# Patient Record
Sex: Male | Born: 1990 | Race: Black or African American | Hispanic: No | Marital: Single | Smoking: Never smoker
Health system: Southern US, Community
[De-identification: ages and names within clinical notes are randomized; demographics above are authoritative.]

## PROBLEM LIST (undated history)

## (undated) DIAGNOSIS — J45909 Unspecified asthma, uncomplicated: Secondary | ICD-10-CM

---

## 2015-10-16 ENCOUNTER — Emergency Department
Admission: EM | Admit: 2015-10-16 | Discharge: 2015-10-16 | Disposition: A | Discharge status: Home / Self Care | Payer: Self-pay | Attending: Emergency Medicine | Admitting: Emergency Medicine

## 2015-10-16 ENCOUNTER — Encounter: Payer: Self-pay | Admitting: Emergency Medicine

## 2015-10-16 ENCOUNTER — Emergency Department: Payer: Self-pay

## 2015-10-16 DIAGNOSIS — Y998 Other external cause status: Secondary | ICD-10-CM | POA: Insufficient documentation

## 2015-10-16 DIAGNOSIS — S0990XA Unspecified injury of head, initial encounter: Secondary | ICD-10-CM | POA: Insufficient documentation

## 2015-10-16 DIAGNOSIS — S01312A Laceration without foreign body of left ear, initial encounter: Secondary | ICD-10-CM | POA: Insufficient documentation

## 2015-10-16 DIAGNOSIS — S0083XA Contusion of other part of head, initial encounter: Secondary | ICD-10-CM

## 2015-10-16 DIAGNOSIS — Y9389 Activity, other specified: Secondary | ICD-10-CM | POA: Insufficient documentation

## 2015-10-16 DIAGNOSIS — S1181XA Laceration without foreign body of other specified part of neck, initial encounter: Secondary | ICD-10-CM | POA: Insufficient documentation

## 2015-10-16 DIAGNOSIS — S0191XA Laceration without foreign body of unspecified part of head, initial encounter: Secondary | ICD-10-CM

## 2015-10-16 DIAGNOSIS — Y9289 Other specified places as the place of occurrence of the external cause: Secondary | ICD-10-CM | POA: Insufficient documentation

## 2015-10-16 DIAGNOSIS — S01512A Laceration without foreign body of oral cavity, initial encounter: Secondary | ICD-10-CM | POA: Insufficient documentation

## 2015-10-16 DIAGNOSIS — S1191XA Laceration without foreign body of unspecified part of neck, initial encounter: Secondary | ICD-10-CM

## 2015-10-16 DIAGNOSIS — Z23 Encounter for immunization: Secondary | ICD-10-CM | POA: Insufficient documentation

## 2015-10-16 HISTORY — DX: Unspecified asthma, uncomplicated: J45.909

## 2015-10-16 MED ORDER — ACETAMINOPHEN 500 MG PO TABS
1000.0000 mg | ORAL_TABLET | Freq: Once | ORAL | Status: AC
  Administered 2015-10-16: 1000 mg via ORAL
  Filled 2015-10-16: qty 2

## 2015-10-16 MED ORDER — TETANUS-DIPHTH-ACELL PERTUSSIS 5-2.5-18.5 LF-MCG/0.5 IM SUSP
0.5000 mL | Freq: Once | INTRAMUSCULAR | Status: AC
  Administered 2015-10-16: 0.5 mL via INTRAMUSCULAR
  Filled 2015-10-16: qty 0.5

## 2015-10-16 MED ORDER — LIDOCAINE HCL (PF) 1 % IJ SOLN
5.0000 mL | Freq: Once | INTRAMUSCULAR | Status: AC
  Administered 2015-10-16: 5 mL
  Filled 2015-10-16: qty 5

## 2015-10-16 MED ORDER — TRAMADOL HCL 50 MG PO TABS: 50.0000 mg | ORAL_TABLET | Freq: Two times a day (BID) | ORAL | Status: AC

## 2015-10-16 MED ORDER — BACITRACIN ZINC 500 UNIT/GM EX OINT
TOPICAL_OINTMENT | Freq: Two times a day (BID) | CUTANEOUS | Status: DC
  Administered 2015-10-16: 10:00:00 via TOPICAL
  Filled 2015-10-16: qty 0.9

## 2015-10-16 NOTE — ED Notes (Signed)
Patient transported to CT 
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
## 2015-10-16 NOTE — ED Notes (Signed)
Returned from CT.
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
## 2015-10-26 ENCOUNTER — Emergency Department
Admission: EM | Admit: 2015-10-26 | Discharge: 2015-10-26 | Disposition: A | Discharge status: Home / Self Care | Payer: Self-pay | Attending: Student | Admitting: Student

## 2015-10-26 DIAGNOSIS — Z4802 Encounter for removal of sutures: Secondary | ICD-10-CM | POA: Insufficient documentation

## 2015-10-26 DIAGNOSIS — Z79899 Other long term (current) drug therapy: Secondary | ICD-10-CM | POA: Insufficient documentation

## 2015-10-26 NOTE — ED Notes (Signed)
Pt alert and oriented X4, active, cooperative, pt in NAD. RR even and unlabored, color WNL.  Pt informed to return if any life threatening symptoms occur.   

## 2015-10-26 NOTE — ED Notes (Signed)
Patient here to have sutures removed from behind left ear.

## 2015-10-26 NOTE — ED Notes (Signed)
Patient states that he is here to have sutures behind left ear removed. Patient denies any drainage or redness to the area.

## 2015-10-26 NOTE — Discharge Instructions (Signed)
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
## 2016-01-24 ENCOUNTER — Emergency Department: Payer: Self-pay

## 2016-01-24 ENCOUNTER — Encounter: Payer: Self-pay | Admitting: *Deleted

## 2016-01-24 DIAGNOSIS — Y9289 Other specified places as the place of occurrence of the external cause: Secondary | ICD-10-CM | POA: Insufficient documentation

## 2016-01-24 DIAGNOSIS — Y990 Civilian activity done for income or pay: Secondary | ICD-10-CM | POA: Insufficient documentation

## 2016-01-24 DIAGNOSIS — Z79899 Other long term (current) drug therapy: Secondary | ICD-10-CM | POA: Insufficient documentation

## 2016-01-24 DIAGNOSIS — Y9389 Activity, other specified: Secondary | ICD-10-CM | POA: Insufficient documentation

## 2016-01-24 DIAGNOSIS — W010XXA Fall on same level from slipping, tripping and stumbling without subsequent striking against object, initial encounter: Secondary | ICD-10-CM | POA: Insufficient documentation

## 2016-01-24 DIAGNOSIS — S7001XA Contusion of right hip, initial encounter: Secondary | ICD-10-CM | POA: Insufficient documentation

## 2016-01-24 NOTE — ED Notes (Addendum)
Pt fell a few days ago co persistent right hip pain. Pt ambulatory at this time.

## 2016-01-24 NOTE — ED Notes (Addendum)
Pt has right hip pain.  Pt slipped on a wet floor 3 days ago at work.  Pt was seen at an urgent care and was given pain meds.  States pain meds are not helping.   States not WC.

## 2016-01-25 ENCOUNTER — Emergency Department
Admission: EM | Admit: 2016-01-25 | Discharge: 2016-01-25 | Disposition: A | Discharge status: Home / Self Care | Payer: Self-pay | Attending: Emergency Medicine | Admitting: Emergency Medicine

## 2016-01-25 DIAGNOSIS — S7001XA Contusion of right hip, initial encounter: Secondary | ICD-10-CM

## 2016-01-25 MED ORDER — CYCLOBENZAPRINE HCL 10 MG PO TABS
10.0000 mg | ORAL_TABLET | Freq: Once | ORAL | Status: AC
  Administered 2016-01-25: 10 mg via ORAL
  Filled 2016-01-25: qty 1

## 2016-01-25 MED ORDER — KETOROLAC TROMETHAMINE 10 MG PO TABS: 10.0000 mg | ORAL_TABLET | Freq: Three times a day (TID) | ORAL | Status: AC | PRN

## 2016-01-25 MED ORDER — KETOROLAC TROMETHAMINE 10 MG PO TABS
10.0000 mg | ORAL_TABLET | Freq: Once | ORAL | Status: AC
  Administered 2016-01-25: 10 mg via ORAL
  Filled 2016-01-25: qty 1
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
## 2016-01-25 NOTE — Discharge Instructions (Signed)

## 2016-08-02 IMAGING — CR DG HIP (WITH OR WITHOUT PELVIS) 2-3V*R*
1 series · 3 of 3 positions shown · non-contrast
Comparison: None.

CLINICAL DATA: The patient slipped on a wet floor 3 days ago with a
right hip injury. Continued pain. Initial encounter.

EXAM:
DG HIP (WITH OR WITHOUT PELVIS) 2-3V RIGHT

[Series 1: t pelvis ap · 0.14mm/px · 3 of 3 slices shown]
[im 1/3]
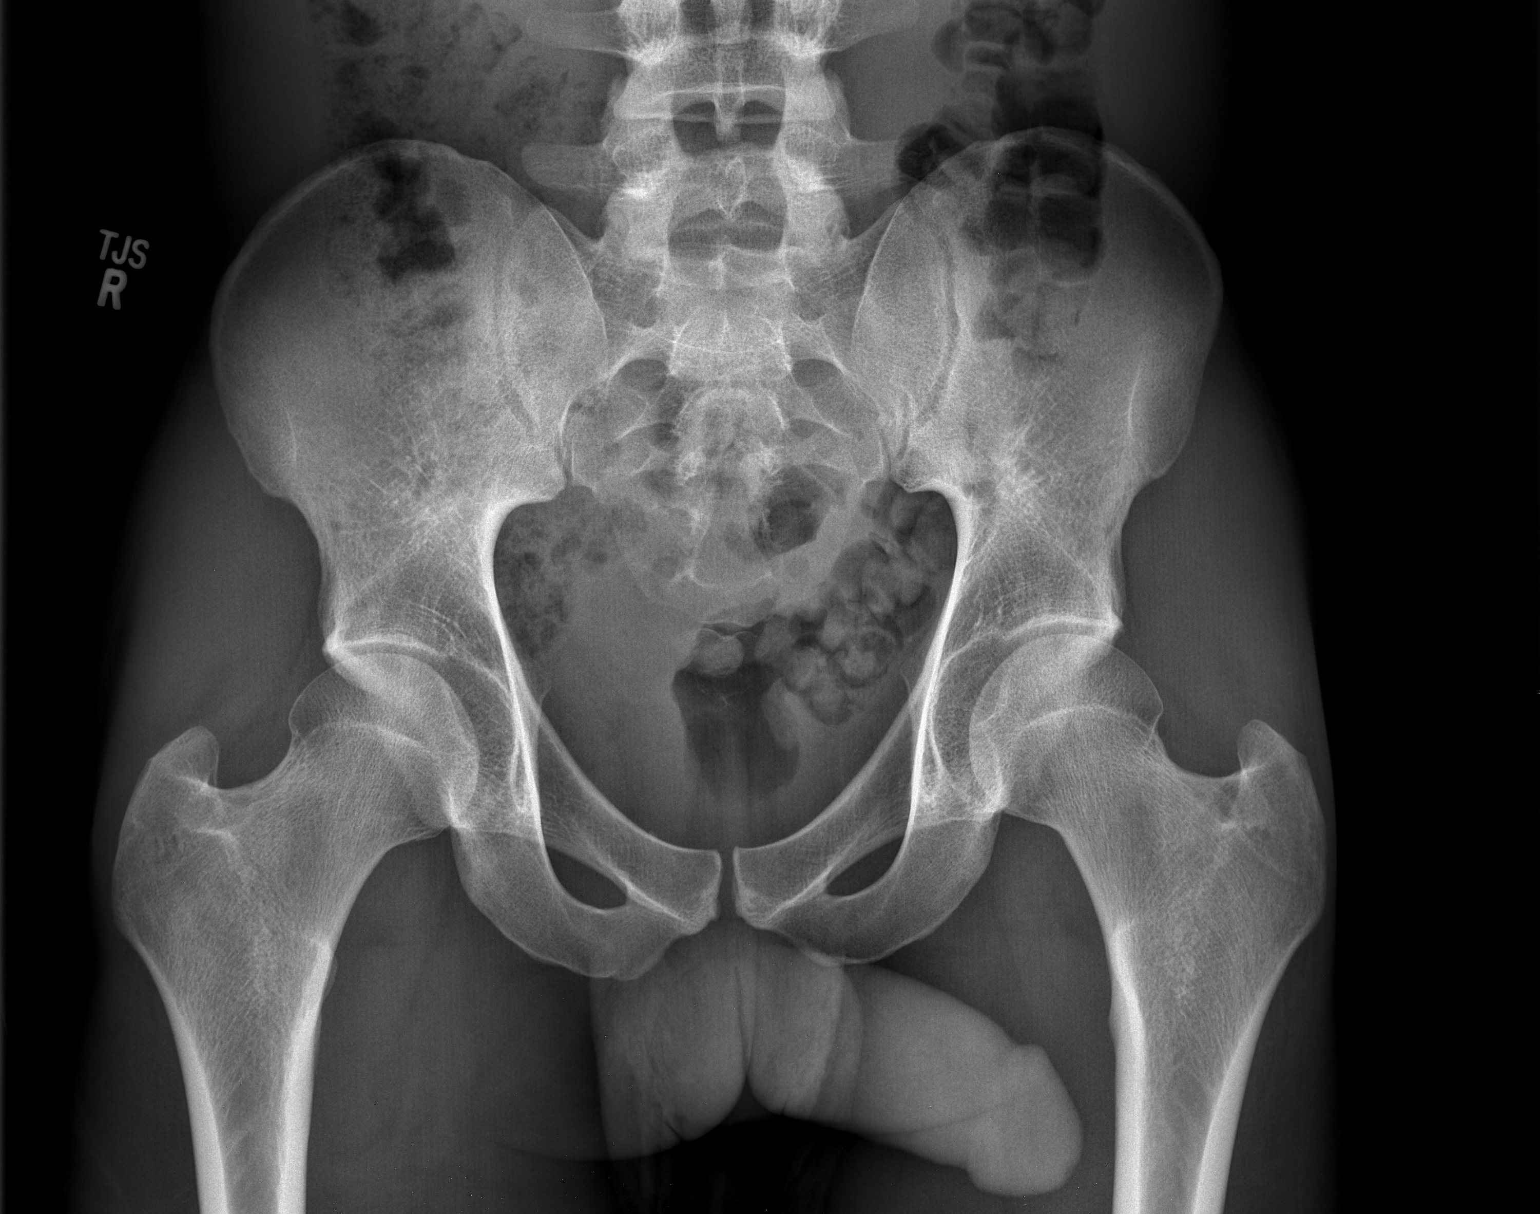
[im 2/3]
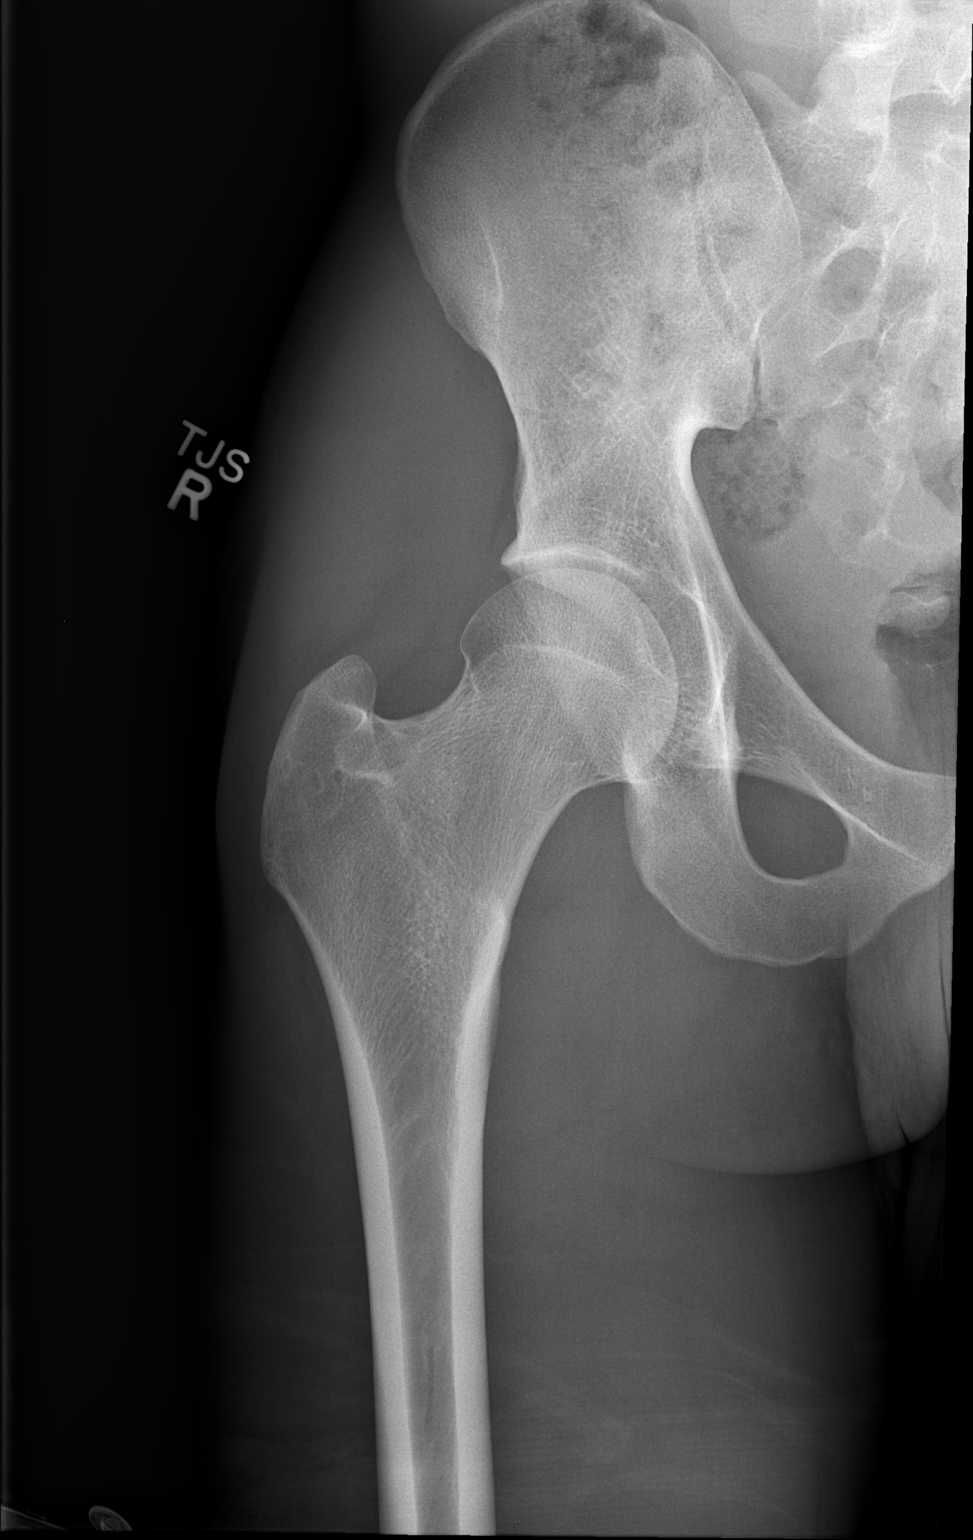
[im 3/3]
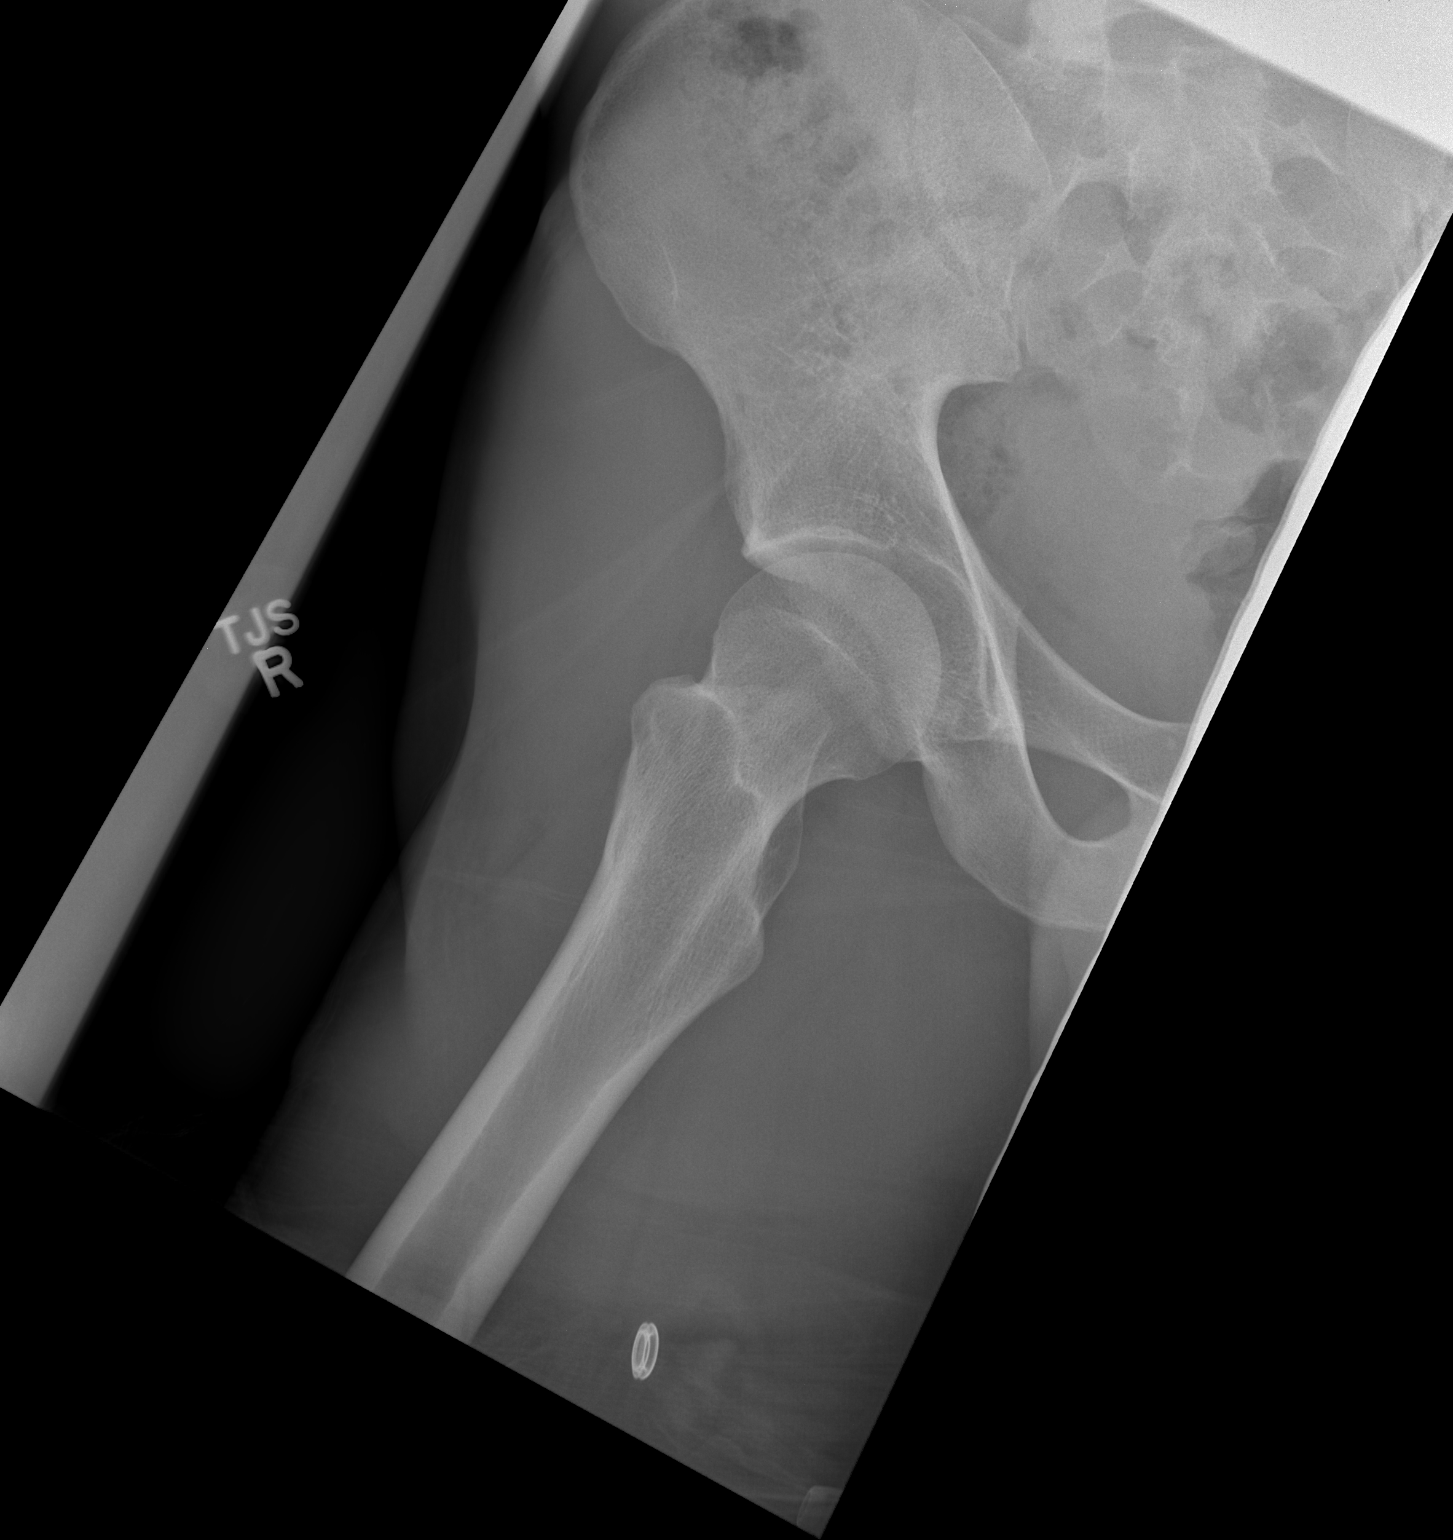

[3 of 3 positions shown; findings below may reference images not displayed]

FINDINGS: There is no evidence of hip fracture or dislocation. There is no
evidence of arthropathy or other focal bone abnormality.
IMPRESSION: Negative exam.
